# Patient Record
Sex: Male | Born: 1950 | Race: White | Hispanic: No
Health system: Southern US, Community
[De-identification: ages and names within clinical notes are randomized; demographics above are authoritative.]

---

## 2013-11-08 ENCOUNTER — Emergency Department: Payer: Self-pay | Admitting: Emergency Medicine

## 2013-11-09 LAB — COMPREHENSIVE METABOLIC PANEL
ALK PHOS: 140 U/L — AB
Albumin: 2.1 g/dL — ABNORMAL LOW (ref 3.4–5.0)
Anion Gap: 4 — ABNORMAL LOW (ref 7–16)
BUN: 19 mg/dL — AB (ref 7–18)
Bilirubin,Total: 1.7 mg/dL — ABNORMAL HIGH (ref 0.2–1.0)
Calcium, Total: 7.9 mg/dL — ABNORMAL LOW (ref 8.5–10.1)
Chloride: 101 mmol/L (ref 98–107)
Co2: 24 mmol/L (ref 21–32)
Creatinine: 1.24 mg/dL (ref 0.60–1.30)
EGFR (African American): >60
EGFR (Non-African Amer.): >60
Glucose: 132 mg/dL — ABNORMAL HIGH (ref 65–99)
Osmolality: 263 (ref 275–301)
Potassium: 4.4 mmol/L (ref 3.5–5.1)
SGOT(AST): 33 U/L (ref 15–37)
SGPT (ALT): 8 U/L — ABNORMAL LOW (ref 12–78)
SODIUM: 129 mmol/L — AB (ref 136–145)
Total Protein: 6.4 g/dL (ref 6.4–8.2)

## 2013-11-09 LAB — CBC
HCT: 30.4 % — AB (ref 40.0–52.0)
HGB: 10 g/dL — AB (ref 13.0–18.0)
MCH: 32.7 pg (ref 26.0–34.0)
MCHC: 32.9 g/dL (ref 32.0–36.0)
MCV: 100 fL (ref 80–100)
Platelet: 131 10*3/uL — ABNORMAL LOW (ref 150–440)
RBC: 3.05 10*6/uL — AB (ref 4.40–5.90)
RDW: 16 % — AB (ref 11.5–14.5)
WBC: 6.1 10*3/uL (ref 3.8–10.6)

## 2013-11-09 LAB — URINALYSIS, COMPLETE
BLOOD: NEGATIVE
Bilirubin,UR: NEGATIVE
Glucose,UR: NEGATIVE mg/dL (ref 0–75)
Hyaline Cast: 12
Ketone: NEGATIVE
LEUKOCYTE ESTERASE: NEGATIVE
Nitrite: NEGATIVE
PROTEIN: NEGATIVE
Ph: 5 (ref 4.5–8.0)
RBC,UR: 2 /HPF (ref 0–5)
SQUAMOUS EPITHELIAL: NONE SEEN
Specific Gravity: 1.021 (ref 1.003–1.030)

## 2013-11-09 LAB — LIPASE, BLOOD: Lipase: 98 U/L (ref 73–393)

## 2013-11-09 LAB — AMMONIA: AMMONIA, PLASMA: 41 umol/L — AB (ref 11–32)

## 2013-11-27 ENCOUNTER — Inpatient Hospital Stay: Payer: Self-pay | Admitting: Family Medicine

## 2013-11-27 LAB — CBC
HCT: 30.8 % — ABNORMAL LOW (ref 40.0–52.0)
HGB: 10.2 g/dL — AB (ref 13.0–18.0)
MCH: 32.9 pg (ref 26.0–34.0)
MCHC: 33.3 g/dL (ref 32.0–36.0)
MCV: 99 fL (ref 80–100)
PLATELETS: 94 10*3/uL — AB (ref 150–440)
RBC: 3.11 10*6/uL — ABNORMAL LOW (ref 4.40–5.90)
RDW: 15.7 % — ABNORMAL HIGH (ref 11.5–14.5)
WBC: 5.9 10*3/uL (ref 3.8–10.6)

## 2013-11-27 LAB — URINALYSIS, COMPLETE
BLOOD: NEGATIVE
Bilirubin,UR: NEGATIVE
Glucose,UR: 50 mg/dL (ref 0–75)
Ketone: NEGATIVE
LEUKOCYTE ESTERASE: NEGATIVE
Nitrite: NEGATIVE
Ph: 5 (ref 4.5–8.0)
Protein: NEGATIVE
RBC, UR: NONE SEEN /HPF (ref 0–5)
SPECIFIC GRAVITY: 1.016 (ref 1.003–1.030)
SQUAMOUS EPITHELIAL: NONE SEEN
WBC UR: NONE SEEN /HPF (ref 0–5)

## 2013-11-27 LAB — COMPREHENSIVE METABOLIC PANEL
ALBUMIN: 1.9 g/dL — AB (ref 3.4–5.0)
ALK PHOS: 182 U/L — AB
ANION GAP: 3 — AB (ref 7–16)
AST: 51 U/L — AB (ref 15–37)
BILIRUBIN TOTAL: 1.9 mg/dL — AB (ref 0.2–1.0)
BUN: 17 mg/dL (ref 7–18)
Calcium, Total: 7.9 mg/dL — ABNORMAL LOW (ref 8.5–10.1)
Chloride: 101 mmol/L (ref 98–107)
Co2: 25 mmol/L (ref 21–32)
Creatinine: 1.15 mg/dL (ref 0.60–1.30)
EGFR (African American): >60
EGFR (Non-African Amer.): >60
Glucose: 207 mg/dL — ABNORMAL HIGH (ref 65–99)
Osmolality: 267 (ref 275–301)
POTASSIUM: 5.3 mmol/L — AB (ref 3.5–5.1)
SGPT (ALT): 10 U/L — ABNORMAL LOW (ref 12–78)
Sodium: 129 mmol/L — ABNORMAL LOW (ref 136–145)
Total Protein: 6.3 g/dL — ABNORMAL LOW (ref 6.4–8.2)

## 2013-11-27 LAB — LIPASE, BLOOD: LIPASE: 143 U/L (ref 73–393)

## 2013-11-27 LAB — AMMONIA: Ammonia, Plasma: 97 mcmol/L — ABNORMAL HIGH (ref 11–32)

## 2013-11-27 LAB — PROTIME-INR
INR: 1.3
PROTHROMBIN TIME: 16.1 s — AB (ref 11.5–14.7)

## 2013-11-28 LAB — CBC WITH DIFFERENTIAL/PLATELET
Basophil #: 0.1 10*3/uL (ref 0.0–0.1)
Basophil %: 1 %
EOS ABS: 0.4 10*3/uL (ref 0.0–0.7)
EOS PCT: 6.6 %
HCT: 26.3 % — ABNORMAL LOW (ref 40.0–52.0)
HGB: 9.1 g/dL — ABNORMAL LOW (ref 13.0–18.0)
LYMPHS ABS: 0.2 10*3/uL — AB (ref 1.0–3.6)
Lymphocyte %: 4.6 %
MCH: 34.1 pg — ABNORMAL HIGH (ref 26.0–34.0)
MCHC: 34.7 g/dL (ref 32.0–36.0)
MCV: 98 fL (ref 80–100)
MONO ABS: 0.7 x10 3/mm (ref 0.2–1.0)
MONOS PCT: 12.5 %
Neutrophil #: 4.1 10*3/uL (ref 1.4–6.5)
Neutrophil %: 75.3 %
Platelet: 89 10*3/uL — ABNORMAL LOW (ref 150–440)
RBC: 2.68 10*6/uL — AB (ref 4.40–5.90)
RDW: 16 % — ABNORMAL HIGH (ref 11.5–14.5)
WBC: 5.5 10*3/uL (ref 3.8–10.6)

## 2013-11-28 LAB — COMPREHENSIVE METABOLIC PANEL
ALBUMIN: 1.9 g/dL — AB (ref 3.4–5.0)
AST: 35 U/L (ref 15–37)
Alkaline Phosphatase: 171 U/L — ABNORMAL HIGH
Anion Gap: 5 — ABNORMAL LOW (ref 7–16)
BUN: 15 mg/dL (ref 7–18)
Bilirubin,Total: 1.5 mg/dL — ABNORMAL HIGH (ref 0.2–1.0)
CALCIUM: 7.9 mg/dL — AB (ref 8.5–10.1)
CHLORIDE: 102 mmol/L (ref 98–107)
Co2: 24 mmol/L (ref 21–32)
Creatinine: 1.25 mg/dL (ref 0.60–1.30)
EGFR (Non-African Amer.): >60
GLUCOSE: 156 mg/dL — AB (ref 65–99)
OSMOLALITY: 267 (ref 275–301)
POTASSIUM: 4.7 mmol/L (ref 3.5–5.1)
SGPT (ALT): 8 U/L — ABNORMAL LOW (ref 12–78)
SODIUM: 131 mmol/L — AB (ref 136–145)
TOTAL PROTEIN: 6.2 g/dL — AB (ref 6.4–8.2)

## 2013-11-28 LAB — MAGNESIUM: Magnesium: 2 mg/dL

## 2014-01-03 DEATH — deceased

## 2014-06-28 IMAGING — US US GUIDE NEEDLE - US PARA
1 series · 11 of 11 positions shown · non-contrast
Comparison: None.

CLINICAL DATA: Ascites

EXAM:
ULTRASOUND GUIDED right lower quadrant PARACENTESIS

[Series 1: us guide needle - us para · 0.27mm/px · 11 of 11 slices shown]
[im 1/11]
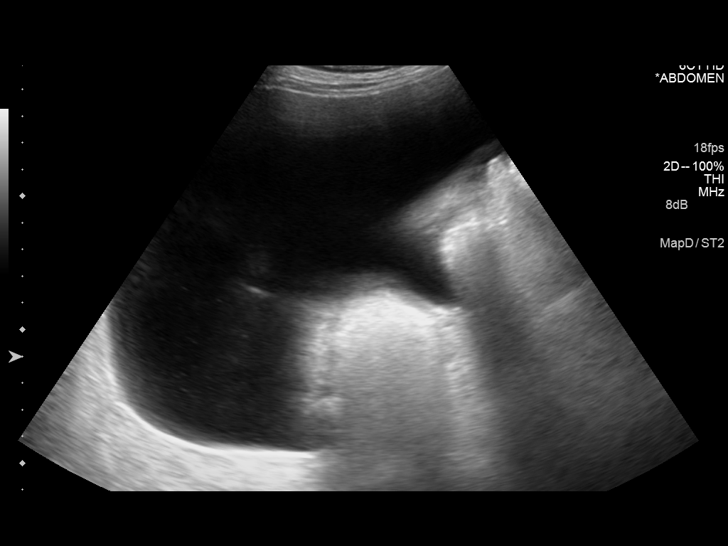
[im 2/11]
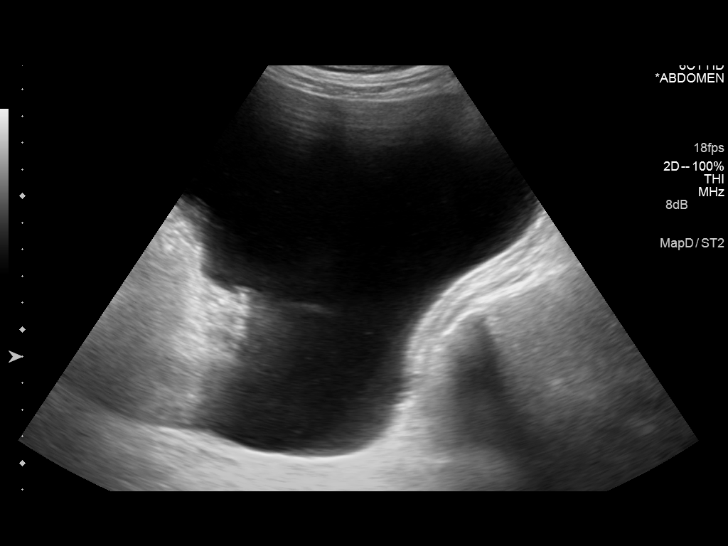
[im 3/11]
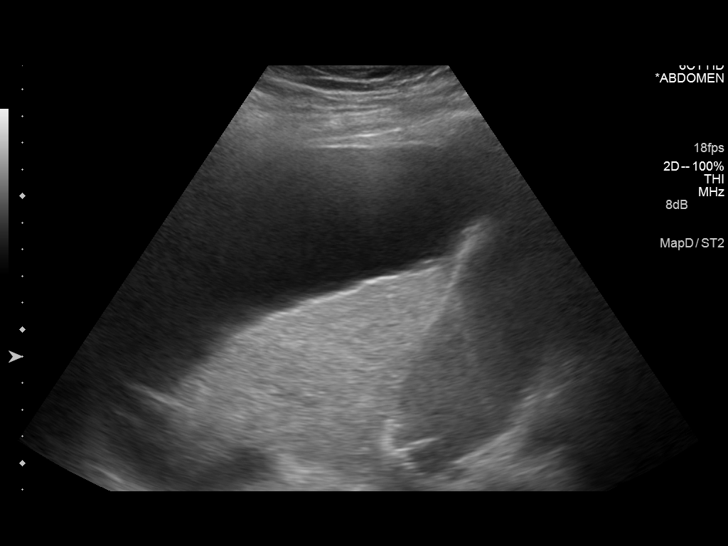
[im 4/11]
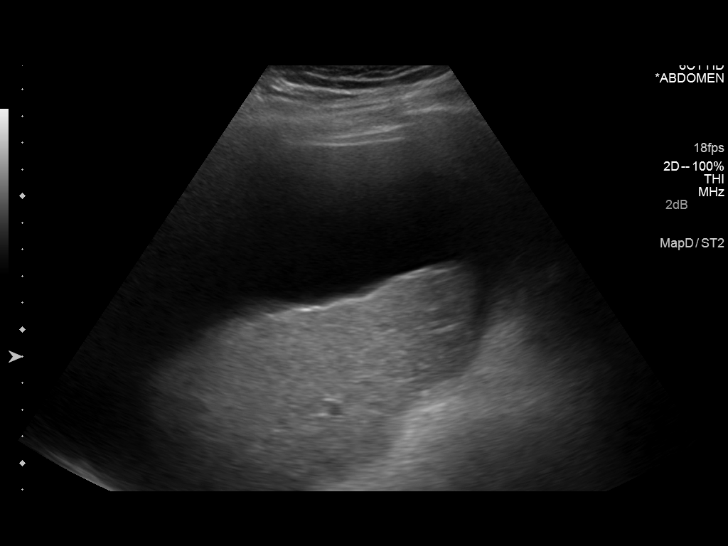
[im 5/11]
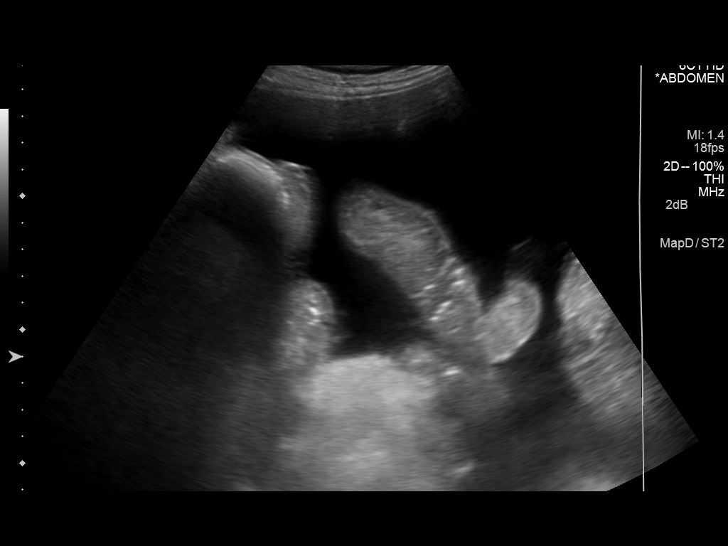
[im 6/11]
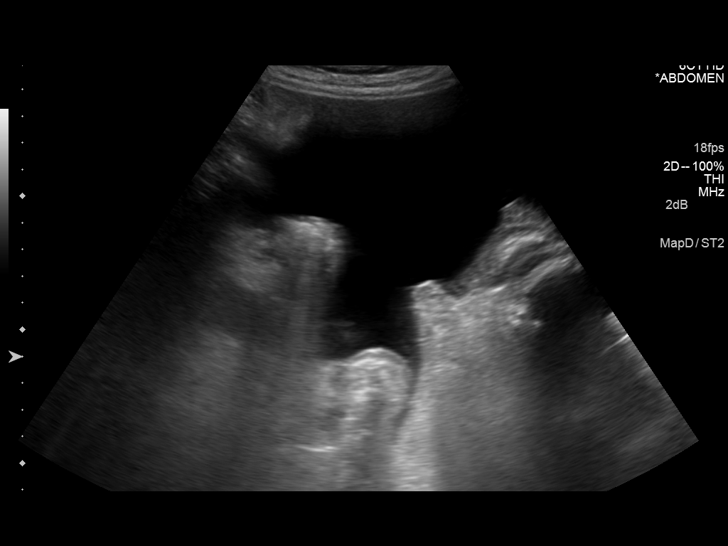
[im 7/11]
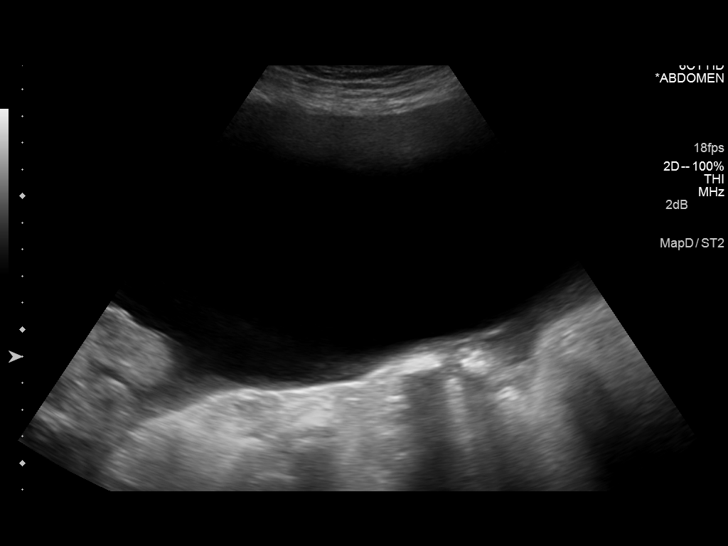
[im 8/11]
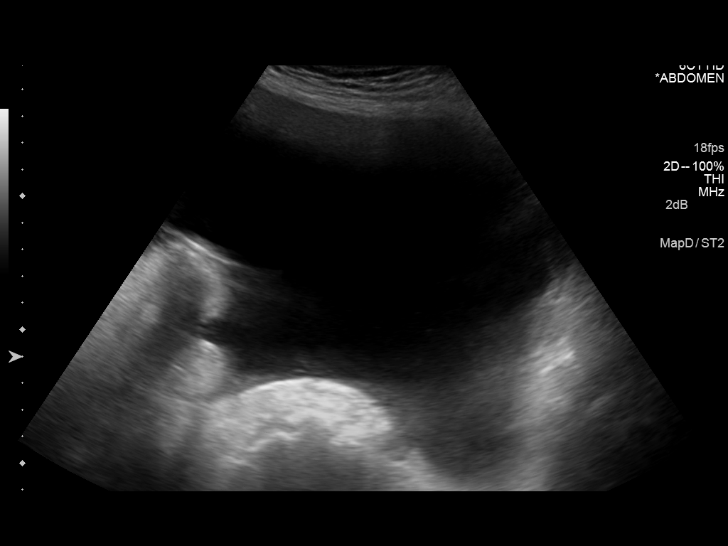
[im 9/11]
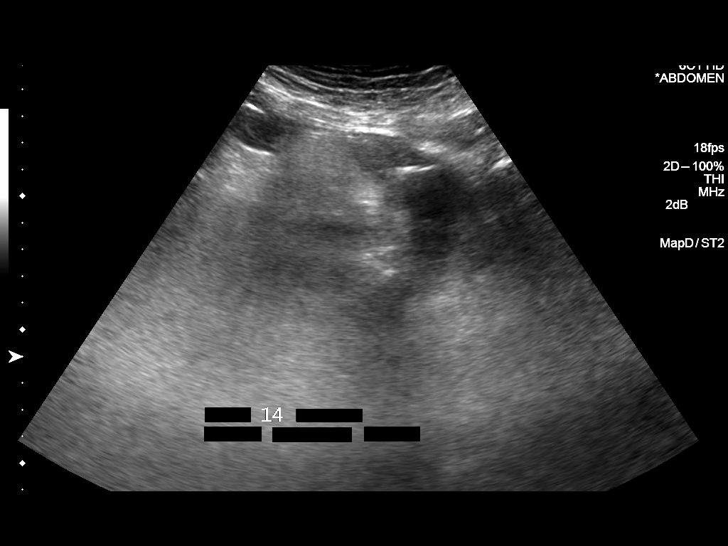
[im 10/11]
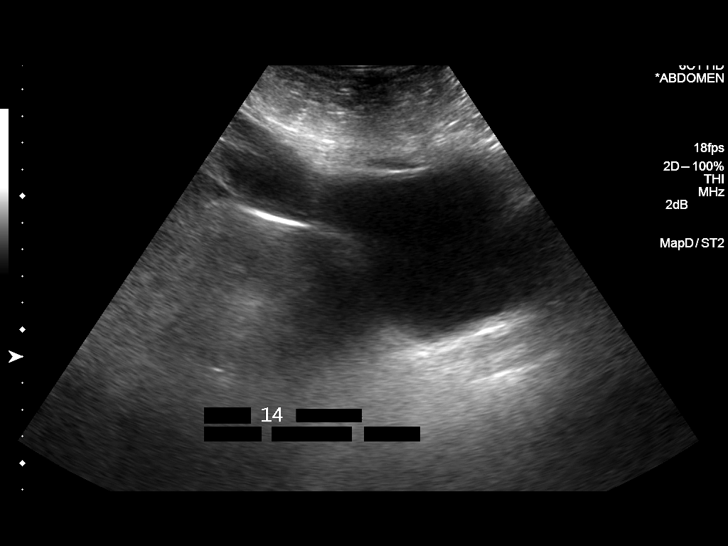
[im 11/11]
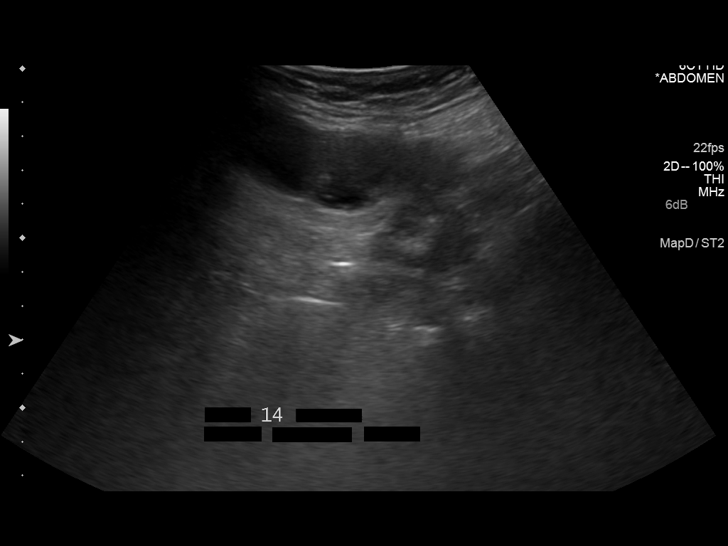

[11 of 11 positions shown; findings below may reference images not displayed]

PROCEDURE:
An ultrasound guided paracentesis was thoroughly discussed with the
patient and questions answered. The benefits, risks, alternatives
and complications were also discussed. The patient understands and
wishes to proceed with the procedure. Written consent was obtained.

Ultrasound was performed to localize and mark an adequate pocket of
fluid in the right lower quadrant of the abdomen. The area was then
prepped and draped in the normal sterile fashion. 1% Lidocaine was
used for local anesthesia. Under ultrasound guidance a
Safe-T-Centesis catheter was introduced. Paracentesis was performed.
The catheter was removed and a dressing applied.

Complications: None.
FINDINGS: A total of approximately 14 L of clear yellow fluid was removed. A
fluid sample was not sent for laboratory analysis.
IMPRESSION: Successful ultrasound guided paracentesis yielding 14 L of ascites.

## 2014-06-28 IMAGING — CR DG CHEST 1V PORT
1 series · 1 of 1 positions shown · non-contrast
Comparison: 11/27/2013

CLINICAL DATA: Followup pleural effusion

EXAM:
PORTABLE CHEST - 1 VIEW

[ap]
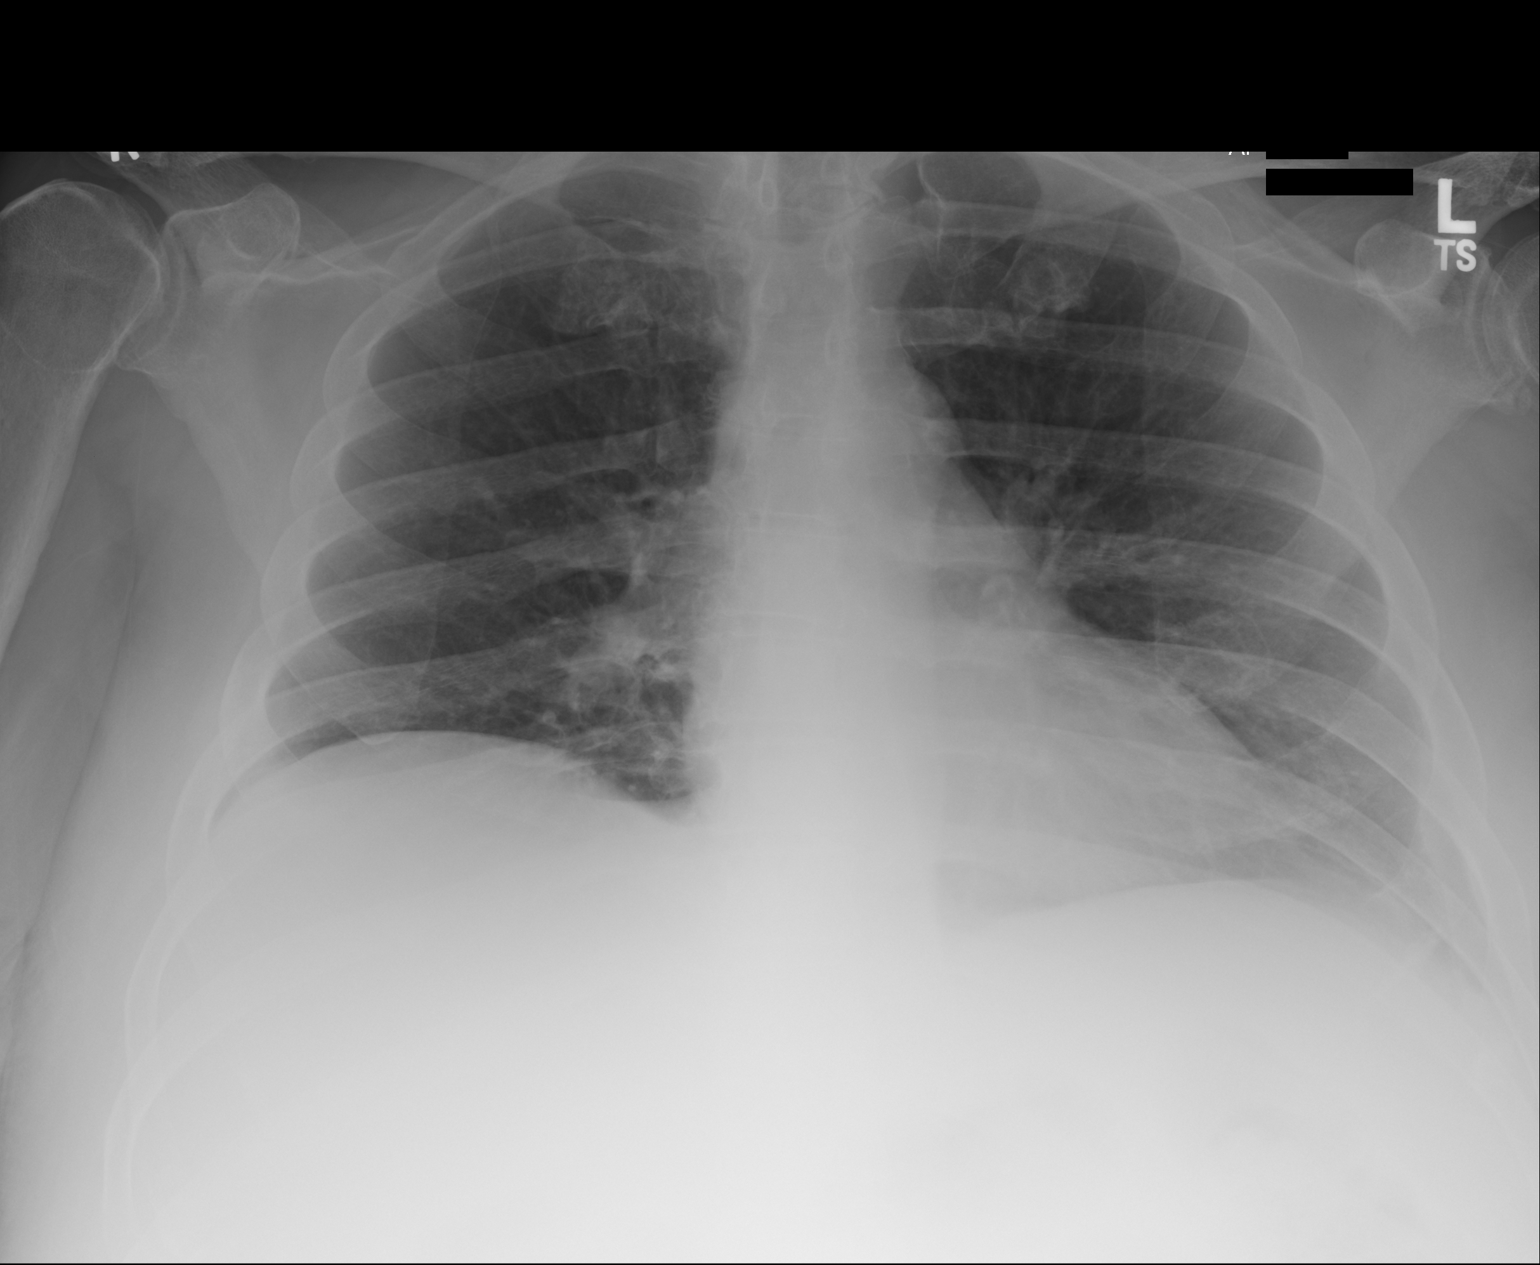

[1 of 1 positions shown; findings below may reference images not displayed]

FINDINGS: Cardiomediastinal silhouette is stable. No acute infiltrate or
pulmonary edema. Trace residual left pleural effusion with left
basilar atelectasis.
IMPRESSION: No infiltrate or pulmonary edema. Trace residue left pleural
effusion with left basilar atelectasis.

## 2014-12-27 NOTE — Discharge Summary (Signed)
PATIENT NAME:  [name] Orr, [name] Orr MR#:  [id number] DATE OF BIRTH:  Feb 05, 1951  DATE OF ADMISSION:  11/27/2013 DATE OF DISCHARGE:  11/29/2013  ADDENDUM:  To previously dictated discharge summary on 26th of March. The patient could not go yesterday as he started having some abdominal pain and nausea, also vomiting. He underwent abdominal x-ray which was negative for any perforation or acute pathology, although it did show possible mild ileus for which he was kept in observation overnight and now he is being discharged back to the facility. He was able to tolerate some diet this morning. He keeps refusing IV lines and really adamant on going back to the facility, although I do feel he certainly could be up there longer to make sure he tolerates diet and can be advanced, although the patient continues to be adamant on his request wanting to go back so we will respect that and let him go back to [facility] today. We will try to at least have him take full liquid diet and the patient was instructed to use symptomatic treatment with Zofran and/or Reglan while at the facility if he continues to be symptomatic. His diet needs to be advanced slowly to a regular diet if he does not have more nausea, vomiting or abdominal pain.   TOTAL TIME TAKING CARE OF THIS PATIENT: 45 minutes.   ____________________________ Ellamae SiaVipul S. Sherryll BurgerShah, MD vss:sb D: 11/29/2013 10:33:52 ET T: 11/29/2013 10:58:48 ET JOB#: [id number]  cc: Ahnyla Mendel S. Sherryll BurgerShah, MD, <Dictator> Ellamae SiaVIPUL S [facility] L[facility] MD ELECTRONICALLY SIGNED 11/29/2013 19:28
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
## 2014-12-27 NOTE — Discharge Summary (Signed)
PATIENT NAME:  [name] Orr, [name] MR#:  [id number] DATE OF BIRTH:  1951/07/01  DATE OF ADMISSION:  11/27/2013 DATE OF DISCHARGE:  11/28/2013  DISCHARGE DIAGNOSES: Severe ascites status post successful ultrasound-guided paracentesis with removal 14 L of fluid with significant clinical improvement. The patient feeling great.   SECONDARY DIAGNOSES:  1.  Cryptogenic cirrhosis.  2.  Non-insulin-dependent diabetes.  3.  End-stage liver disease.  4.  Ascites.  5.  Anasarca.  6.  Chronic kidney disease.  7.  Anxiety. 8.  Depression.   CONSULTATIONS: None.   PROCEDURES AND RADIOLOGY:  1.  Chest x-ray on the 25th of March showed a small left pleural effusion.  2.  Left neck catheter, with abnormal course, likely requiring replacement or repositioning.  3.  Chest x-ray on the 26th of March showed no infiltrate or pulmonary edema. Trace residual left pleural effusion or left basilar atelectasis.  4.  Abdominal ultrasound on the 25th of March showed a large amount of abdominal ascites.  5.  Ultrasound-guided paracentesis on the 26th of March showed a total of approximately 14 L of clear yellow fluid with removal successfully without any immediate complication.   MAJOR LABORATORY PANEL: Urinalysis on admission was negative.   HISTORY AND SHORT HOSPITAL COURSE: The patient is a 64 year old male with above-mentioned medical problems who was admitted for severe symptomatic ascites. Please see Dr. Ardine Orr's dictated history and physical for further details. The patient had a successful ultrasound-guided paracentesis and had about 14 to 16 L of fluid removed. He was feeling significantly better and was feeling completely a new person after the paracentesis and was in agreement to get back to his facility in stable condition.   DISCHARGE PHYSICAL EXAMINATION:  VITAL SIGNS: Temperature 98.6, heart rate 95 per minute, respirations 20 per minute, blood pressure 121/72 mmHg. He was saturating 100% on room air.   CARDIOVASCULAR: S1, S2 normal. No murmurs, rubs or gallop.  LUNGS: Clear to auscultation bilaterally. No wheezing, rales, rhonchi or crepitation.  ABDOMEN: Soft, obese, benign.  NEUROLOGIC: Nonfocal examination.   All other physical examination remained at baseline.   DISCHARGE MEDICATIONS:  1.  Fentanyl 12 mcg patch transdermal every 72 hours minutes.  2.  Lithium 300 mg p.o. daily.  3.  Lactulose 60 mL p.o. b.i.d.  4.  Dulcolax suppository rectally once daily as needed.  5.  Lorazepam 0.5 mg/1 mL topical every 6 hours as needed.   DISCHARGE DIET: Low sodium, low fat, low cholesterol.   DISCHARGE ACTIVITY: As tolerated.   DISCHARGE INSTRUCTIONS AND FOLLOWUP: The patient was instructed to follow up with his primary care physician, [name] Orr, in 1 to 2 weeks. [facility]   TOTAL TIME DISCHARGING THIS PATIENT: 45 minutes.   ____________________________ Ellamae SiaVipul S. Sherryll BurgerShah, Orr vss:np D: 11/28/2013 14:38:04 ET T: 11/28/2013 15:13:53 ET JOB#: [id number]  cc: [name] Jenny S. Sherryll BurgerShah, Orr, <Dictator> [name] ShellerErnest B. Maryellen PileEason, Orr [facility] Ellamae SiaVIPUL S [facility] Orr ELECTRONICALLY SIGNED 11/29/2013 19:28
